# Patient Record
Sex: Male | Born: 1955 | Race: White | Hispanic: No | Marital: Single
Health system: Southern US, Community
[De-identification: ages and names within clinical notes are randomized; demographics above are authoritative.]

---

## 2004-01-18 ENCOUNTER — Encounter: Admission: RE | Admit: 2004-01-18 | Discharge: 2004-01-18 | Payer: Self-pay | Admitting: Family Medicine

## 2004-02-05 ENCOUNTER — Ambulatory Visit (HOSPITAL_COMMUNITY): Admission: RE | Admit: 2004-02-05 | Discharge: 2004-02-06 | Payer: Self-pay | Admitting: Neurosurgery

## 2004-04-15 ENCOUNTER — Encounter: Admission: RE | Admit: 2004-04-15 | Discharge: 2004-04-15 | Payer: Self-pay | Admitting: Family Medicine

## 2004-11-27 ENCOUNTER — Encounter: Admission: RE | Admit: 2004-11-27 | Discharge: 2004-11-27 | Payer: Self-pay | Admitting: Neurosurgery

## 2004-12-09 ENCOUNTER — Ambulatory Visit (HOSPITAL_COMMUNITY): Admission: RE | Admit: 2004-12-09 | Discharge: 2004-12-10 | Payer: Self-pay | Admitting: Neurosurgery

## 2008-02-09 ENCOUNTER — Encounter: Admission: RE | Admit: 2008-02-09 | Discharge: 2008-02-09 | Payer: Self-pay | Admitting: Neurosurgery

## 2009-05-14 ENCOUNTER — Encounter: Admission: RE | Admit: 2009-05-14 | Discharge: 2009-05-14 | Payer: Self-pay | Admitting: Family Medicine

## 2010-07-04 IMAGING — CT CT HEAD W/O CM
2 series · 16 of 30 positions shown, 18 images · non-contrast
Comparison: None.

CLINICAL DATA: Dizziness.  Lightheadedness.  Recent blunt head
trauma.

CT HEAD WITHOUT CONTRAST
TECHNIQUE: Contiguous axial images were obtained from the base of
the skull through the vertex without contrast.

[Series 3: head bone · axial · 0.45mm/px · z∈[+16,+128]mm · 8 of 56 slices shown]
[im 6/56  bone]
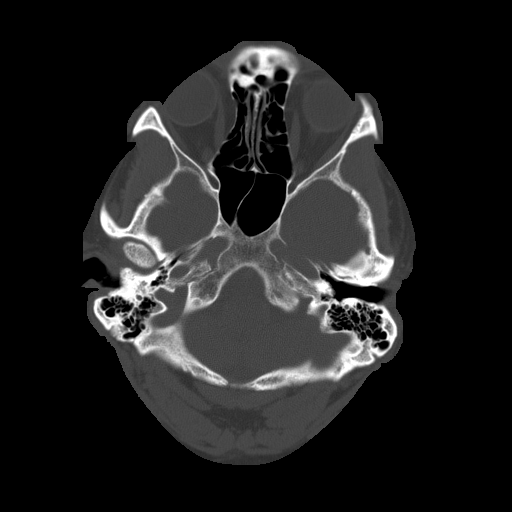
[im 12/56  bone]
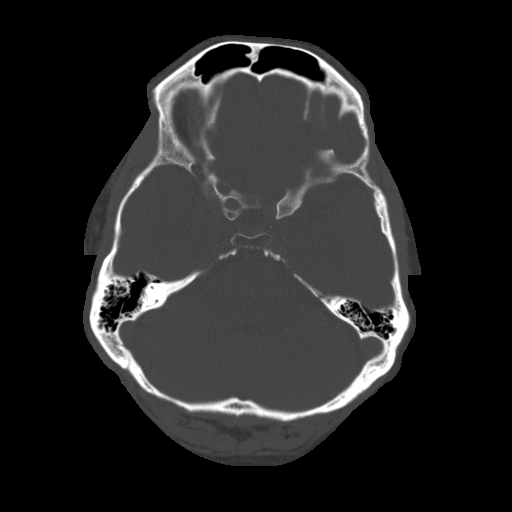
[im 18/56  bone]
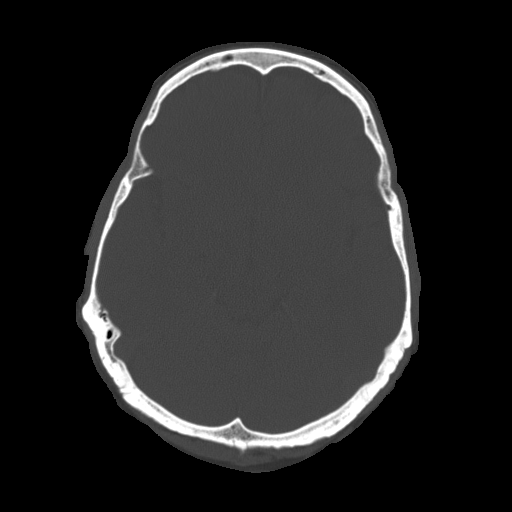
[im 24/56  bone]
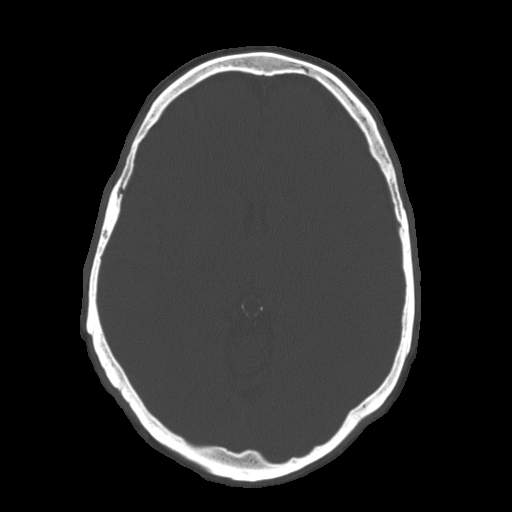
[im 32/56  bone]
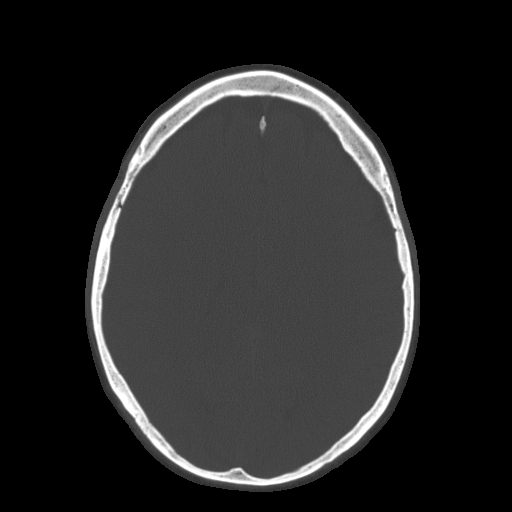
[im 38/56  bone]
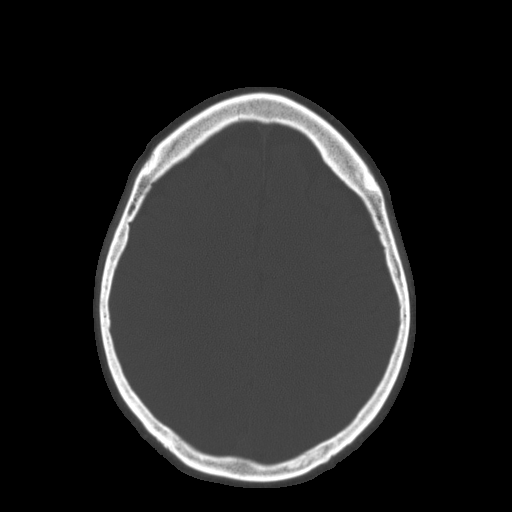
[im 44/56  bone]
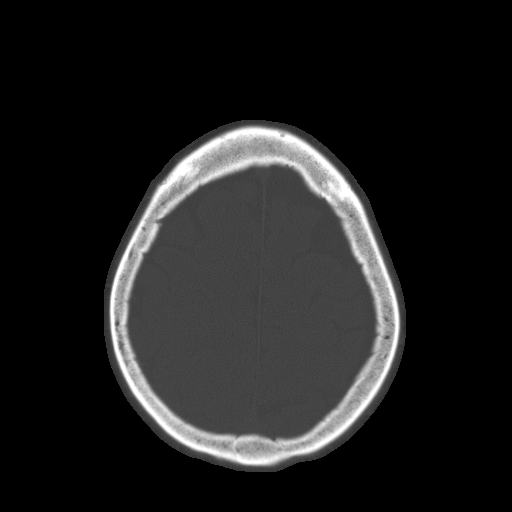
[im 50/56  bone]
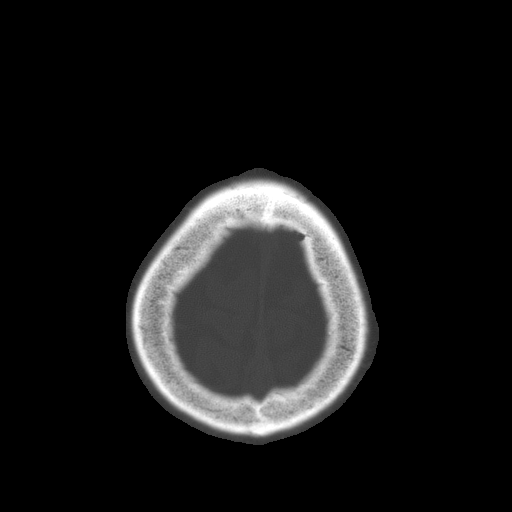

[Series 32: 3d filtered head w/o · axial · non-contrast · 0.45mm/px · z∈[+19,+126]mm · 8 of 28 slices shown, 10 images]
[im 4/28  brain]
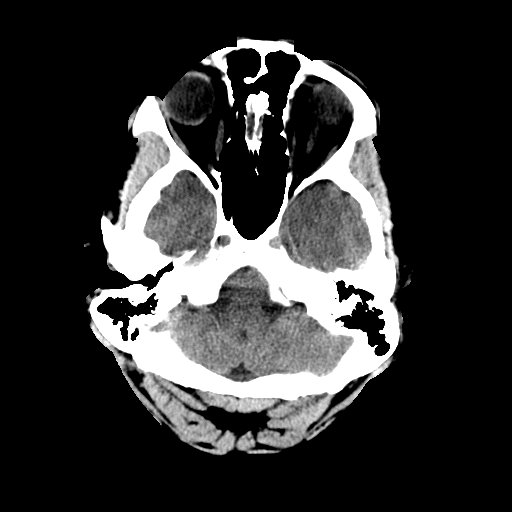
[im 4/28  bone]
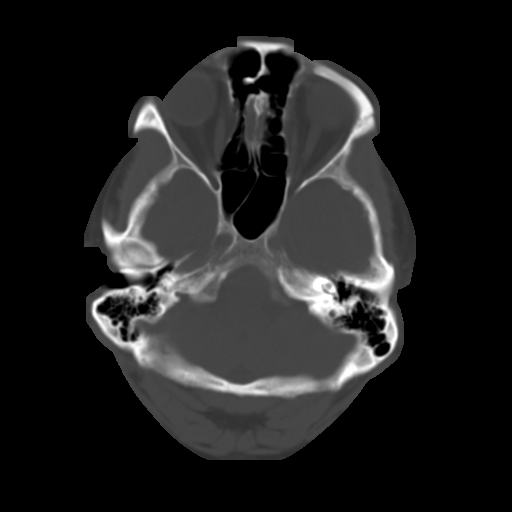
[im 7/28  brain]
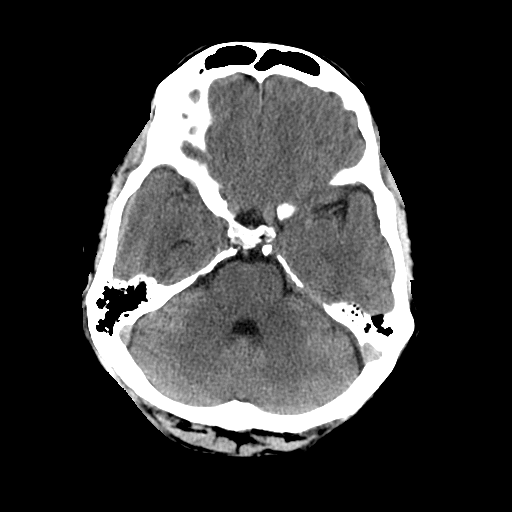
[im 10/28  brain]
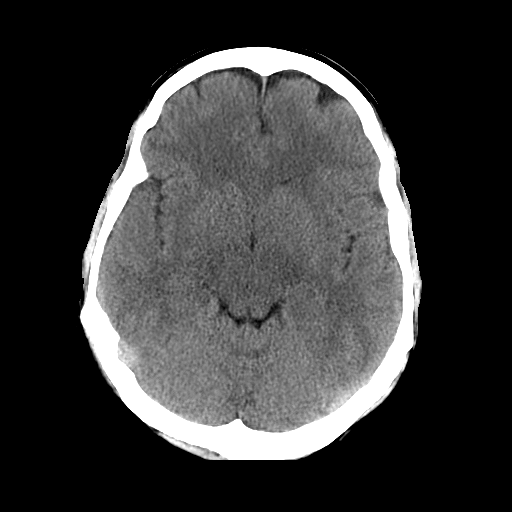
[im 13/28  brain]
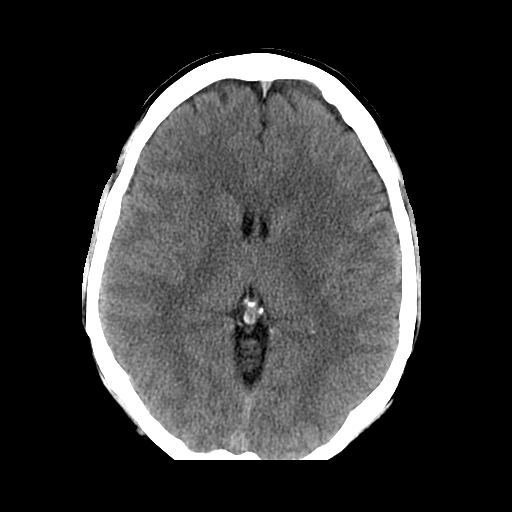
[im 16/28  brain]
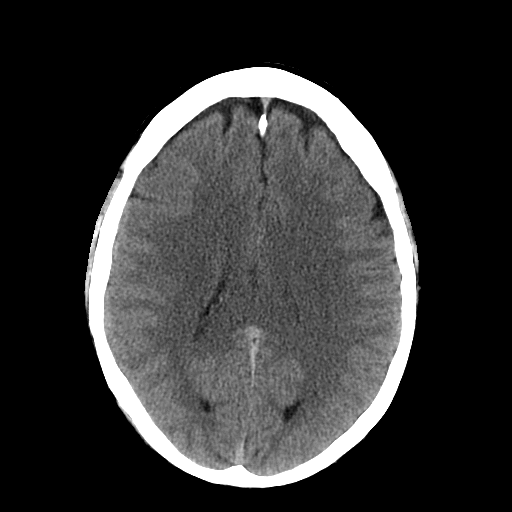
[im 16/28  bone]
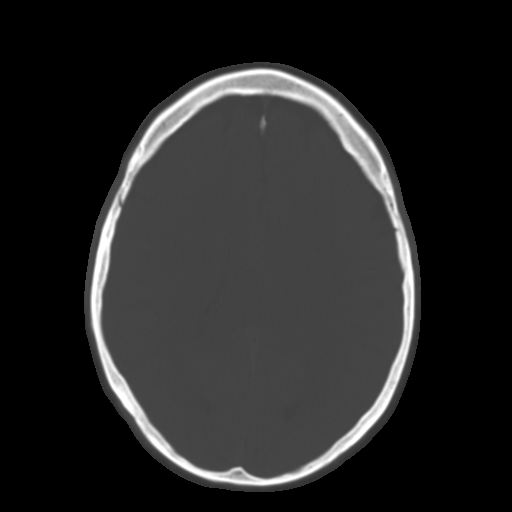
[im 19/28  brain]
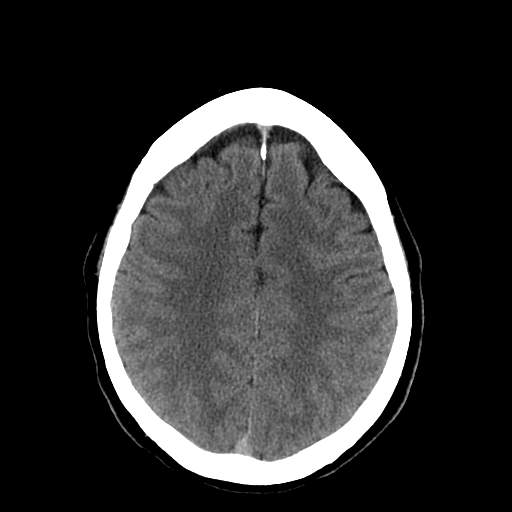
[im 22/28  brain]
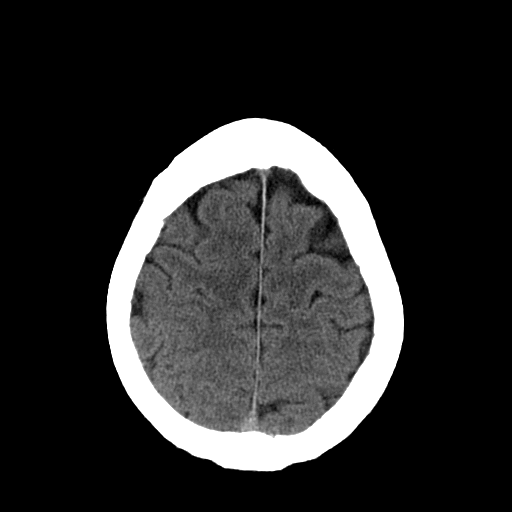
[im 25/28  brain]
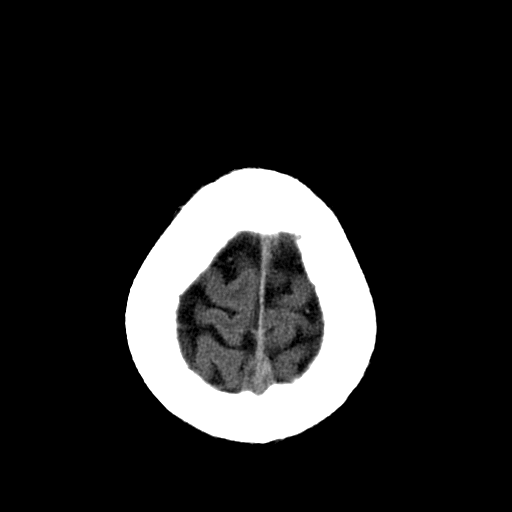

[16 of 30 positions shown; findings below may reference images not displayed]

FINDINGS: There is no evidence of intracranial hemorrhage, brain
edema or other signs of acute infarction.  There is no evidence of
intracranial mass lesion or mass effect.  No abnormal extra-axial
fluid collections are identified.

The ventricles normal in size.  No other intracranial abnormality
identified.  There is no evidence of skull fracture.  Incidental
note is made of an old fracture of the right medial orbital wall.
IMPRESSION: 1.  No evidence of intracranial abnormality or skull fracture.
2.  Old right medial orbital wall fracture incidentally noted.

## 2010-08-26 NOTE — Op Note (Signed)
NAMEDERRY, KASSEL NO.:  [id number]   MEDICAL RECORD NO.:  [id number]          PATIENT TYPE:  OIB   LOCATION:  2899                         FACILITY:  [facility]   PHYSICIAN:  Danae Orleans. Venetia Maxon, M.D.  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   PROCEDURE:  Left L5-S1 microdiskectomy with microdissection.   SURGEON:  Danae Orleans. Venetia Maxon, M.D.   ASSISTANT:  Payton Doughty, M.D.   ANESTHESIA:  General endotracheal anesthesia.   ESTIMATED BLOOD LOSS:  Minimal.   COMPLICATIONS:  None.   DISPOSITION:  To recovery.   INDICATIONS:  Francis Turner is a 55 year old man with left leg pain with  a large free-fragment disk herniation at the L5-S1 level on the left.  It  was elected to take him to surgery for microdiskectomy at the affected  level.   PROCEDURE:  Francis Turner was brought to the operating room.  Following the  satisfactory and uncomplicated induction of general endotracheal anesthesia  and placement of intravenous lines, the patient was placed in prone position  on the operating table.  His low back was then prepped and draped in the  usual sterile fashion.  The area of planned incision was infiltrated with  0.25% Marcaine and 0.5% lidocaine and 1:200,000 epinephrine.  He was placed  on the Wilson frame prior to prepping and draping.  The area of planned  incision was infiltrated with 0.25% Marcaine and 0.5% lidocaine and  1:200,000 epinephrine.  An incision was made overlying the L5-S1 interspace,  carried through adipose tissue to the lumbar dorsal fascia, which was  incised on the left side of midline.   Subperiosteal dissection was  performed, exposing what was felt to be the L5-S1 interspace.  An  intraoperative x-ray confirmed correct level with a marker probe at the L5-  S1 interspace on the left.  Using a high-speed drill, a hemisemilaminectomy  of L5 was then performed.  The Kerrison rongeur was used to remove  ligamentum flavum in a piecemeal fashion and the common dural tube and S1  nerve root were identified.  A foraminotomy was performed overlying the S1  nerve root.  The S1 nerve root and common dural tube were then mobilized  medially, exposing multiple large fragments of herniated disk material,  which were directly compressing the nerve root.  The tail of the herniated  disk fragment appeared to go into the annulus and there was a large hole in  the annulus, and consequently it was elected to evacuate the disk space more  thoroughly.  This was done under microscopic visualization and the end  plates were stripped of residual disk material.  Both the medial and lateral  aspects of the interspace were also decompressed.  The nerve root was felt  to be well-decompressed, as  was the common dural tube.  There was no  evidence of any residual disk material.  Hemostasis was assured with Gelfoam  soaked in thrombin.  Depo-Medrol 80 mg and 2 mL of fentanyl were then placed  in the operative bed.  The microscope was taken out of the field and the  lumbar dorsal fascia was closed with 0 Vicryl suture, the subcutaneous  tissues were reapproximated with 2-0 Vicryl interrupted inverted sutures,  and the skin edges were reapproximated with interrupted 3-0 Vicryl  subcuticular stitch.  The wound was dressed with Dermabond.  The patient was  extubated in the operating room and taken to the recovery room in stable and  satisfactory condition, having tolerated his operation well.  The counts  were correct at the end of the case.      Jose   JDS/MEDQ  D:  02/05/2004  T:  02/05/2004  Job:   [id number]

## 2010-08-26 NOTE — Op Note (Signed)
NAMEEBB, CARELOCK           ACCOUNT NO.:  [id number]   MEDICAL RECORD NO.:  [id number]          PATIENT TYPE:  AMB   LOCATION:  SDS                          FACILITY:  [facility]   PHYSICIAN:  Danae Orleans. Venetia Maxon, M.D.  DATE OF BIRTH:  15-May-1955   DATE OF PROCEDURE:  12/09/2004  DATE OF DISCHARGE:                                 OPERATIVE REPORT   PREOPERATIVE DIAGNOSIS:  Left L5-S1 recurrent herniated lumbar disk  spondylosis degenerative disk disease and radiculopathy.   POSTOPERATIVE DIAGNOSIS:  Left L5-S1 recurrent herniated lumbar disk  spondylosis degenerative disk disease and radiculopathy.   PROCEDURES:  Left L5-S1 redo microdiskectomy.   SURGEON:  Danae Orleans. Venetia Maxon, M.D.   ASSISTANT:  Hilda Lias, M.D.   ANESTHESIA:  General endotracheal anesthesia.   ESTIMATED BLOOD LOSS:  Minimal.   COMPLICATIONS:  None.   DISPOSITION:  To recovery.   INDICATIONS:  Francis Turner is a 55 year old man who had previously  undergone left L5-S1 microdiskectomy.  He did well.  Following surgery, he  had a late recurrence of the herniated disk with recurrence of left leg  pain.  It was elected to take him to surgery for redo microdiskectomy.   PROCEDURE:  [name] was brought to the operating room.  Following  satisfactory and uncomplicated induction of general endotracheal anesthesia,  __________, the patient was placed in the prone position on the Wilson  frame.  His low back was then prepped and draped in the usual sterile  fashion.  The area of planned incision was infiltrated with 0.25% Marcaine  and 0.5% lidocaine with 1:200,000 epinephrine.  After infiltrating the skin  and subcutaneous tissues with 0.25% Marcaine, 0.5% lidocaine with 1:200,000  epinephrine, an incision was made in the midline through a previous scar and  carried to the lumbodorsal fascia, which was incised to the left side of  midline.  Subperiosteal dissection was performed, exposing the L5-S1  interspace in the previous laminotomy defect.  Intraoperative x-ray  confirmed correct orientation, slight extension of the hemi-laminectomy of  L5 was then performed with high speed drill and Kerrison rongeurs, and the  dura was identified and decompressed.  The lateral recess was decompressed.  The S1 nerve root was identified.  The microscope was brought into the  field, and using high powered microscopic visualization, the S1 nerve root  was identified and mobilized, and a large fragment of disk material directly  beneath the nerve root was removed, resulting in significant decompression  of the thecal sac and S1 nerve root.  The interspace was then further  cleared of residual disk material, both medially and laterally, then the  foraminal region was felt to be well decompressed, as was the more midline  space, and plates were stripped of residual disk material.  The wound was  then copiously irrigated with bacitracin saline.  The interspace was  __________ 80 mg Depo-Medrol and 2 cc of fentanyl.  The microscope was taken  off the field.  The dorsal lumbar fascia was closed with 0 Vicryl sutures.  Subcutaneous tissues were reapproximated with 2-0 Vicryl interrupted  inverted sutures, and the skin  edges were reapproximated with interrupted 3-  0 Vicryl subcuticular stitch, and the wound was dressed with Dermabond.  The  patient was extubated in the operating room and taken to the recovery room  in stable, satisfactory condition, having tolerated this operative procedure  well.  All counts were correct at the end of the case.      Danae Orleans. Venetia Maxon, M.D.  Electronically Signed     JDS/MEDQ  D:  12/09/2004  T:  12/09/2004  Job:  [id number]

## 2020-12-22 ENCOUNTER — Telehealth: Payer: Self-pay | Admitting: Dermatology

## 2020-12-22 NOTE — Telephone Encounter (Signed)
Notes documented and referral routed back to referring office. 

## 2020-12-22 NOTE — Telephone Encounter (Signed)
Woman called and said she would contact referrer to see about getting him in before next March
